# Patient Record
Sex: Male | Born: 1970 | Race: White | Hispanic: No | Marital: Married | Smoking: Former smoker
Health system: Southern US, Community
[De-identification: ages and names within clinical notes are randomized; demographics above are authoritative.]

## PROBLEM LIST (undated history)

## (undated) DIAGNOSIS — F32A Depression, unspecified: Secondary | ICD-10-CM

## (undated) DIAGNOSIS — E785 Hyperlipidemia, unspecified: Secondary | ICD-10-CM

## (undated) DIAGNOSIS — F419 Anxiety disorder, unspecified: Secondary | ICD-10-CM

## (undated) DIAGNOSIS — G4730 Sleep apnea, unspecified: Secondary | ICD-10-CM

## (undated) DIAGNOSIS — D699 Hemorrhagic condition, unspecified: Secondary | ICD-10-CM

## (undated) DIAGNOSIS — F329 Major depressive disorder, single episode, unspecified: Secondary | ICD-10-CM

## (undated) HISTORY — DX: Sleep apnea, unspecified: G47.30

## (undated) HISTORY — DX: Hyperlipidemia, unspecified: E78.5

## (undated) HISTORY — DX: Hemorrhagic condition, unspecified: D69.9

## (undated) HISTORY — PX: HEMORROIDECTOMY: SUR656

## (undated) HISTORY — PX: WISDOM TOOTH EXTRACTION: SHX21

## (undated) HISTORY — DX: Depression, unspecified: F32.A

## (undated) HISTORY — DX: Major depressive disorder, single episode, unspecified: F32.9

## (undated) HISTORY — DX: Anxiety disorder, unspecified: F41.9

---

## 2006-04-18 ENCOUNTER — Encounter: Admission: RE | Admit: 2006-04-18 | Discharge: 2006-04-18 | Payer: Self-pay | Admitting: Occupational Medicine

## 2008-02-23 ENCOUNTER — Ambulatory Visit: Payer: Self-pay | Admitting: Unknown Physician Specialty

## 2011-05-15 ENCOUNTER — Ambulatory Visit: Payer: Self-pay | Admitting: Internal Medicine

## 2017-04-15 ENCOUNTER — Other Ambulatory Visit: Payer: Self-pay | Admitting: Neurology

## 2017-04-15 DIAGNOSIS — R519 Headache, unspecified: Secondary | ICD-10-CM

## 2017-04-15 DIAGNOSIS — R51 Headache: Principal | ICD-10-CM

## 2017-04-21 ENCOUNTER — Other Ambulatory Visit: Payer: Self-pay | Admitting: Neurology

## 2017-04-21 ENCOUNTER — Ambulatory Visit
Admission: RE | Admit: 2017-04-21 | Discharge: 2017-04-21 | Disposition: A | Discharge status: Home / Self Care | Payer: Commercial Managed Care - PPO | Source: Ambulatory Visit | Attending: Neurology | Admitting: Neurology

## 2017-04-21 DIAGNOSIS — T1590XA Foreign body on external eye, part unspecified, unspecified eye, initial encounter: Secondary | ICD-10-CM

## 2017-04-21 DIAGNOSIS — R519 Headache, unspecified: Secondary | ICD-10-CM

## 2017-04-21 DIAGNOSIS — R51 Headache: Secondary | ICD-10-CM | POA: Insufficient documentation

## 2018-02-23 ENCOUNTER — Other Ambulatory Visit: Payer: Self-pay | Admitting: Physical Medicine and Rehabilitation

## 2018-02-23 DIAGNOSIS — M5441 Lumbago with sciatica, right side: Secondary | ICD-10-CM

## 2018-03-12 ENCOUNTER — Ambulatory Visit: Payer: Commercial Managed Care - PPO

## 2018-03-30 ENCOUNTER — Ambulatory Visit
Admission: RE | Admit: 2018-03-30 | Discharge: 2018-03-30 | Disposition: A | Discharge status: Home / Self Care | Payer: Commercial Managed Care - PPO | Source: Ambulatory Visit | Attending: Physical Medicine and Rehabilitation | Admitting: Physical Medicine and Rehabilitation

## 2018-03-30 DIAGNOSIS — M5441 Lumbago with sciatica, right side: Secondary | ICD-10-CM

## 2018-03-30 DIAGNOSIS — G8929 Other chronic pain: Secondary | ICD-10-CM | POA: Insufficient documentation

## 2018-03-30 DIAGNOSIS — M5116 Intervertebral disc disorders with radiculopathy, lumbar region: Secondary | ICD-10-CM | POA: Diagnosis not present

## 2018-04-01 ENCOUNTER — Other Ambulatory Visit: Payer: Self-pay | Admitting: Family Medicine

## 2018-04-01 DIAGNOSIS — M549 Dorsalgia, unspecified: Secondary | ICD-10-CM

## 2018-04-01 DIAGNOSIS — M5412 Radiculopathy, cervical region: Secondary | ICD-10-CM

## 2018-04-15 ENCOUNTER — Ambulatory Visit
Admission: RE | Admit: 2018-04-15 | Discharge: 2018-04-15 | Disposition: A | Discharge status: Home / Self Care | Payer: Commercial Managed Care - PPO | Source: Ambulatory Visit | Attending: Family Medicine | Admitting: Family Medicine

## 2018-04-15 ENCOUNTER — Ambulatory Visit: Payer: Commercial Managed Care - PPO

## 2018-04-15 DIAGNOSIS — M549 Dorsalgia, unspecified: Secondary | ICD-10-CM | POA: Diagnosis present

## 2018-04-15 DIAGNOSIS — M5412 Radiculopathy, cervical region: Secondary | ICD-10-CM

## 2018-04-30 ENCOUNTER — Encounter: Payer: Self-pay | Admitting: Urology

## 2018-04-30 ENCOUNTER — Ambulatory Visit (INDEPENDENT_AMBULATORY_CARE_PROVIDER_SITE_OTHER): Payer: Commercial Managed Care - PPO | Admitting: Urology

## 2018-04-30 VITALS — BP 137/83 | HR 63 | Ht 71.0 in | Wt 214.6 lb

## 2018-04-30 DIAGNOSIS — R3129 Other microscopic hematuria: Secondary | ICD-10-CM | POA: Diagnosis not present

## 2018-05-01 ENCOUNTER — Ambulatory Visit: Payer: Self-pay | Admitting: Urology

## 2018-05-01 NOTE — Progress Notes (Signed)
04/30/2018 8:09 AM   Jesse Mcconnell [birth year] [id number]  Referring provider: Marguarite Arbour, MD [address] [facility] [place], [place] [address]  Chief Complaint  Patient presents with  . Hematuria    HPI: 47 year old male seen in consultation at the request of Dr. Judithann Sheen for microhematuria and right flank pain.  He has a long history of microscopic hematuria.  He was apparently seen at [facility] approximately 10 years ago and had a CT scan which showed no abnormalities.  He does not recall having a cystoscopy.  He has chronic back pain and recently underwent MRI of the spine which showed no significant abnormalities.  He has recently had onset of right flank pain which is nonradiating.  There are no identifiable precipitating, aggravating or alleviating factors.  He has no prior history of stone disease.  He has no bothersome lower urinary tract symptoms.  Urinalysis in August 2019 showed 4-10 RBCs.  He has had periodic microscopic hematuria on urinalysis dating back to 2015.  He denies gross hematuria.  He occasionally notes some mild left flank pain.   PMH: Past Medical History:  Diagnosis Date  . Anxiety   . Bleeding disorder (HCC)   . Depression   . Hyperlipidemia   . Hypertension   . Sleep apnea     Surgical History: Past Surgical History:  Procedure Laterality Date  . HEMORROIDECTOMY    . WISDOM TOOTH EXTRACTION      Home Medications:  Allergies as of 04/30/2018      Reactions   Amoxicillin-pot Clavulanate Other (See Comments)   Levofloxacin Other (See Comments)   produces monilia   Nsaids Other (See Comments)      Medication List        Accurate as of 04/30/18 11:59 PM. Always use your most recent med list.          ALPRAZolam 1 MG tablet Commonly known as:  XANAX TAKE 1 TABLET (1 MG TOTAL) BY MOUTH 3 (THREE) TIMES DAILY   atorvastatin 80 MG tablet Commonly known as:  LIPITOR   bisoprolol-hydrochlorothiazide 5-6.25 MG  tablet Commonly known as:  ZIAC   escitalopram 10 MG tablet Commonly known as:  LEXAPRO   gemfibrozil 600 MG tablet Commonly known as:  LOPID   hydrocortisone 2.5 % cream APPLY TO AFFECTED AREA TWICE A DAY   levocetirizine 5 MG tablet Commonly known as:  XYZAL every evening.   lidocaine 2 % jelly Commonly known as:  XYLOCAINE APPLY TOPICALLY ONCE DAILY AS NEEDED FOR PAIN (RECTAL PAIN)   metaxalone 800 MG tablet Commonly known as:  SKELAXIN TAKE 1 TABLET BY MOUTH 3 TIMES DAILY AS NEEDED FOR PAIN OR SPASM   methocarbamol 500 MG tablet Commonly known as:  ROBAXIN TAKE 1/2-1 TABLET BY MOUTH 3 TIMES A DAY AS NEEDED   tiZANidine 2 MG tablet Commonly known as:  ZANAFLEX   zolpidem 5 MG tablet Commonly known as:  AMBIEN       Allergies:  Allergies  Allergen Reactions  . Amoxicillin-Pot Clavulanate Other (See Comments)  . Levofloxacin Other (See Comments)    produces monilia  . Nsaids Other (See Comments)    Family History: Family History  Problem Relation Age of Onset  . Rheum arthritis Father     Social History:  reports that he quit smoking about 9 years ago. His smoking use included cigarettes. He quit after 27.00 years of use. He has never used smokeless tobacco. He reports that he  drinks alcohol. He reports that he does not use drugs.  ROS: UROLOGY Frequent Urination?: Yes Hard to postpone urination?: No Burning/pain with urination?: Yes Get up at night to urinate?: Yes Leakage of urine?: No Urine stream starts and stops?: Yes Trouble starting stream?: Yes Do you have to strain to urinate?: No Blood in urine?: Yes Urinary tract infection?: No Sexually transmitted disease?: No Injury to kidneys or bladder?: No Painful intercourse?: No Weak stream?: No Erection problems?: No Penile pain?: No  Gastrointestinal Nausea?: No Vomiting?: No Indigestion/heartburn?: No Diarrhea?: No Constipation?: No  Constitutional Fever: No Night sweats?:  Yes Weight loss?: No Fatigue?: Yes  Skin Skin rash/lesions?: No Itching?: No  Eyes Blurred vision?: No Double vision?: No  Ears/Nose/Throat Sore throat?: No Sinus problems?: No  Hematologic/Lymphatic Swollen glands?: No Easy bruising?: No  Cardiovascular Leg swelling?: No Chest pain?: No  Respiratory Cough?: No Shortness of breath?: No  Endocrine Excessive thirst?: No  Musculoskeletal Back pain?: Yes Joint pain?: Yes  Neurological Headaches?: No Dizziness?: No  Psychologic Depression?: Yes Anxiety?: Yes  Physical Exam: BP 137/83 (BP Location: Left Arm, Patient Position: Sitting, Cuff Size: Large)   Pulse 63   Ht 5\' 11"  (1.803 m)   Wt 214 lb 9.6 oz (97.3 kg)   BMI 29.93 kg/m   Constitutional:  Alert and oriented, No acute distress. HEENT: [place] AT, moist mucus membranes.  Trachea midline, no masses. Cardiovascular: No clubbing, cyanosis, or edema. Respiratory: Normal respiratory effort, no increased work of breathing. GI: Abdomen is soft, nontender, nondistended, no abdominal masses GU: No CVA tenderness Lymph: No cervical or inguinal lymphadenopathy. Skin: No rashes, bruises or suspicious lesions. Neurologic: Grossly intact, no focal deficits, moving all 4 extremities. Psychiatric: Normal mood and affect.  Laboratory Data:  Urinalysis Dipstick/microscopy today negative   Assessment & Plan:   47 year old male with microhematuria.  He has chronic back pain but recently noted some increased right back pain.  He will sign a record release to request his [facility] urologic records.  I have recommended scheduling a CT urogram and he was informed of the possible need for cystoscopy in the future.   Riki Altes, MD  [facility] [address] [place], [place] [address] [phone]

## 2018-05-13 ENCOUNTER — Telehealth: Payer: Self-pay | Admitting: Urology

## 2018-05-13 DIAGNOSIS — R3129 Other microscopic hematuria: Secondary | ICD-10-CM

## 2018-05-13 NOTE — Telephone Encounter (Signed)
The order was entered.

## 2018-05-13 NOTE — Telephone Encounter (Signed)
Can you please put order in for pt to have CT per office note.  Thank you!

## 2018-05-13 NOTE — Addendum Note (Signed)
Addended by: Irineo AxonSTOIOFF, SCOTT C on: 05/13/2018 01:01 PM   Modules accepted: Orders

## 2018-05-29 ENCOUNTER — Ambulatory Visit
Admission: RE | Admit: 2018-05-29 | Discharge: 2018-05-29 | Disposition: A | Discharge status: Home / Self Care | Payer: Commercial Managed Care - PPO | Source: Ambulatory Visit | Attending: Urology | Admitting: Urology

## 2018-05-29 DIAGNOSIS — R3129 Other microscopic hematuria: Secondary | ICD-10-CM | POA: Insufficient documentation

## 2018-05-29 MED ORDER — IOPAMIDOL (ISOVUE-300) INJECTION 61%
125.0000 mL | Freq: Once | INTRAVENOUS | Status: AC | PRN
  Administered 2018-05-29: 125 mL via INTRAVENOUS

## 2018-06-08 ENCOUNTER — Telehealth: Payer: Self-pay

## 2018-06-08 NOTE — Telephone Encounter (Signed)
-----   Message from Riki Altes, MD sent at 06/07/2018  9:21 PM EDT ----- CTU showed no abnormalities- recc sched cystoscopy

## 2018-06-08 NOTE — Telephone Encounter (Signed)
Called pt informed him of the results below. Pt gave verbal understanding, pt declines scheduling Cysto at this time.

## 2019-06-21 ENCOUNTER — Other Ambulatory Visit: Payer: Self-pay

## 2019-06-21 DIAGNOSIS — Z20822 Contact with and (suspected) exposure to covid-19: Secondary | ICD-10-CM

## 2019-06-22 LAB — NOVEL CORONAVIRUS, NAA: SARS-CoV-2, NAA: NOT DETECTED
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
## 2019-10-31 ENCOUNTER — Ambulatory Visit: Payer: Commercial Managed Care - PPO | Attending: Internal Medicine

## 2019-10-31 DIAGNOSIS — Z23 Encounter for immunization: Secondary | ICD-10-CM | POA: Insufficient documentation

## 2019-10-31 NOTE — Progress Notes (Signed)
   Covid-19 Vaccination Clinic  Name:  Jesse Mcconnell    MRN: [id number] DOB: 01-03-1971  10/31/2019  [name] was observed post Covid-19 immunization for 15 minutes without incident. He was provided with Vaccine Information Sheet and instruction to access the V-Safe system.   [name] was instructed to call 911 with any severe reactions post vaccine: Marland Kitchen Difficulty breathing  . Swelling of face and throat  . A fast heartbeat  . A bad rash all over body  . Dizziness and weakness   Immunizations Administered    Name Date Dose VIS Date Route   Pfizer COVID-19 Vaccine 10/31/2019  8:15 AM 0.3 mL 08/06/2019 Intramuscular   Manufacturer: [employer], [employer]   Lot: [id number]   NDC: [address]-4

## 2019-11-24 ENCOUNTER — Ambulatory Visit: Payer: Commercial Managed Care - PPO | Attending: Internal Medicine

## 2019-11-24 DIAGNOSIS — Z23 Encounter for immunization: Secondary | ICD-10-CM

## 2019-11-24 NOTE — Progress Notes (Signed)
   Covid-19 Vaccination Clinic  Name:  Jesse Mcconnell    MRN: [id number] DOB: [birth year]  11/24/2019  [name] was observed post Covid-19 immunization for 15 minutes without incident. He was provided with Vaccine Information Sheet and instruction to access the V-Safe system.   [name] was instructed to call 911 with any severe reactions post vaccine: Marland Kitchen Difficulty breathing  . Swelling of face and throat  . A fast heartbeat  . A bad rash all over body  . Dizziness and weakness   Immunizations Administered    Name Date Dose VIS Date Route   Pfizer COVID-19 Vaccine 11/24/2019  3:16 PM 0.3 mL 08/06/2019 Intramuscular   Manufacturer: [employer], [employer]   Lot: [id number]   NDC: [address]-4
